# Patient Record
Sex: Female | Born: 1970 | Race: White | Hispanic: No | Marital: Married | Smoking: Never smoker
Health system: Southern US, Community
[De-identification: ages and names within clinical notes are randomized; demographics above are authoritative.]

## PROBLEM LIST (undated history)

## (undated) DIAGNOSIS — T7840XA Allergy, unspecified, initial encounter: Secondary | ICD-10-CM

## (undated) DIAGNOSIS — R011 Cardiac murmur, unspecified: Secondary | ICD-10-CM

## (undated) HISTORY — DX: Cardiac murmur, unspecified: R01.1

## (undated) HISTORY — PX: INNER EAR SURGERY: SHX679

## (undated) HISTORY — DX: Allergy, unspecified, initial encounter: T78.40XA

## (undated) HISTORY — PX: NEPHRECTOMY: SHX65

## (undated) HISTORY — PX: OTHER SURGICAL HISTORY: SHX169

## (undated) HISTORY — PX: BAND HEMORRHOIDECTOMY: SHX1213

---

## 2015-05-18 ENCOUNTER — Other Ambulatory Visit: Payer: Self-pay

## 2015-05-18 ENCOUNTER — Ambulatory Visit
Admission: RE | Admit: 2015-05-18 | Discharge: 2015-05-18 | Disposition: A | Discharge status: Home / Self Care | Payer: 59 | Source: Ambulatory Visit

## 2015-05-18 DIAGNOSIS — Z1231 Encounter for screening mammogram for malignant neoplasm of breast: Secondary | ICD-10-CM

## 2015-05-24 ENCOUNTER — Other Ambulatory Visit: Payer: Self-pay | Admitting: Internal Medicine

## 2015-05-24 DIAGNOSIS — R928 Other abnormal and inconclusive findings on diagnostic imaging of breast: Secondary | ICD-10-CM

## 2015-05-30 ENCOUNTER — Ambulatory Visit
Admission: RE | Admit: 2015-05-30 | Discharge: 2015-05-30 | Disposition: A | Discharge status: Home / Self Care | Payer: 59 | Source: Ambulatory Visit | Attending: Internal Medicine | Admitting: Internal Medicine

## 2015-05-30 DIAGNOSIS — R928 Other abnormal and inconclusive findings on diagnostic imaging of breast: Secondary | ICD-10-CM

## 2015-06-15 ENCOUNTER — Encounter: Payer: Self-pay | Admitting: Internal Medicine

## 2015-06-15 ENCOUNTER — Ambulatory Visit (INDEPENDENT_AMBULATORY_CARE_PROVIDER_SITE_OTHER): Payer: 59 | Admitting: Internal Medicine

## 2015-06-15 VITALS — BP 128/86 | HR 72 | Temp 98.7°F | Resp 16 | Ht 66.5 in | Wt 166.0 lb

## 2015-06-15 DIAGNOSIS — Z0000 Encounter for general adult medical examination without abnormal findings: Secondary | ICD-10-CM | POA: Diagnosis not present

## 2015-06-15 DIAGNOSIS — H9192 Unspecified hearing loss, left ear: Secondary | ICD-10-CM | POA: Insufficient documentation

## 2015-06-15 DIAGNOSIS — M545 Low back pain: Secondary | ICD-10-CM

## 2015-06-15 DIAGNOSIS — G8929 Other chronic pain: Secondary | ICD-10-CM | POA: Insufficient documentation

## 2015-06-15 NOTE — Progress Notes (Signed)
Pre visit review using our clinic review tool, if applicable. No additional management support is needed unless otherwise documented below in the visit note. 

## 2015-06-15 NOTE — Progress Notes (Signed)
Subjective:    Patient ID: Faith Thornton, female    DOB: 06-25-1970, 45 y.o.   MRN: [id number]  HPI She is here to establish with a new pcp.   She is here for a physical.  She is currently being evaluated to be a kidney donor for her brother - being done through [facility]. They have just done complete blood work and she will try to get me a copy.  She sees a chiropractor regularly for occasional back pain.  She is working on increasing her core strength.   Medications and allergies reviewed with patient and updated if appropriate.  Patient Active Problem List   Diagnosis Date Noted  . Chronic lower back pain 06/15/2015    No current outpatient prescriptions on file prior to visit.   No current facility-administered medications on file prior to visit.    Past Medical History  Diagnosis Date  . Allergy   . Heart murmur     Past Surgical History  Procedure Laterality Date  . Inner ear surgery    . Knee surgery Right     torn meniscus  . Fallopian tube removal      (one)  . Cesarean section      one  . Band hemorrhoidectomy      Social History   Social History  . Marital Status: Married    Spouse Name: N/A  . Number of Children: 4  . Years of Education: N/A   Social History Main Topics  . Smoking status: Never Smoker   . Smokeless tobacco: Never Used  . Alcohol Use: Yes     Comment: occasional  . Drug Use: No  . Sexual Activity: Not Asked   Other Topics Concern  . 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 Diabetes Mother   . Arthritis Mother   . Hyperlipidemia Mother   . Diabetes Father   . Hypertension Father   . Hyperlipidemia Father   . Heart disease Father     stents placed  . Kidney disease Brother     cause unknown    Review of Systems  Constitutional: Negative for fever, chills, appetite change, fatigue and unexpected weight  change.  HENT: Positive for hearing loss (otosclerosis).   Eyes: Negative for visual disturbance.  Respiratory: Negative for cough, shortness of breath and wheezing.   Cardiovascular: Positive for palpitations (rare flutter) and leg swelling (occ, if on feet all day). Negative for chest pain.  Gastrointestinal: Negative for nausea, abdominal pain, diarrhea, constipation and blood in stool.       No GERD  Genitourinary: Negative for dysuria and hematuria.  Musculoskeletal: Positive for back pain (chronic, lower back pain - sees chiropractor). Negative for myalgias and arthralgias.  Skin: Negative for rash.  Neurological: Negative for dizziness, light-headedness and headaches.  Psychiatric/Behavioral: Negative for dysphoric mood. The patient is not nervous/anxious.        Objective:   Filed Vitals:   06/15/15 0854  BP: 128/86  Pulse: 72  Temp: 98.7 F (37.1 C)  Resp: 16   Filed Weights   06/15/15 0854  Weight: 166 lb (75.297 kg)   Body mass index is 26.39 kg/(m^2).   Physical Exam Constitutional: She appears well-developed and well-nourished. No distress.  HENT:  Head: Normocephalic and atraumatic.  Right Ear: External ear  normal. Normal ear canal and TM Left Ear: External ear normal.  Normal ear canal and TM Mouth/Throat: Oropharynx is clear and moist.  Normal bilateral ear canals and tympanic membranes  Eyes: Conjunctivae and EOM are normal.  Neck: Neck supple. No tracheal deviation present. No thyromegaly present.  No carotid bruit  Cardiovascular: Normal rate, regular rhythm and normal heart sounds.   No murmur heard.  No edema. Pulmonary/Chest: Effort normal and breath sounds normal. No respiratory distress. She has no wheezes. She has no rales.  Breast: deferred to Gyn Abdominal: Soft. She exhibits no distension. There is no tenderness.  Lymphadenopathy: She has no cervical adenopathy.  Skin: Skin is warm and dry. She is not diaphoretic. cyst top of head that is  benign, soft, slightly tender Psychiatric: She has a normal mood and affect. Her behavior is normal.       Assessment & Plan:   Physical exam: Screening blood work done recently by [employer], no need to repeat Immunizations  - declines flu, ? Td up to date - she will check Mammogram up to date Gyn up to date Exercise - not regular, encouraged regular exercise Weight - just above normal, encouraged regular exercise Skin  -  Cyst on scalp-can see dermatology if she wants to have removed Substance abuse - no evidence of substance abuse   Follow up as needed

## 2015-06-15 NOTE — Patient Instructions (Signed)
All other Health Maintenance issues reviewed.   All recommended immunizations and age-appropriate screenings are up-to-date.  No immunizations administered today.   Medications reviewed and updated.  No changes recommended at this time.    Health Maintenance, Female Adopting a healthy lifestyle and getting preventive care can go a long way to promote health and wellness. Talk with your health care provider about what schedule of regular examinations is right for you. This is a good chance for you to check in with your provider about disease prevention and staying healthy. In between checkups, there are plenty of things you can do on your own. Experts have done a lot of research about which lifestyle changes and preventive measures are most likely to keep you healthy. Ask your health care provider for more information. WEIGHT AND DIET  Eat a healthy diet  Be sure to include plenty of vegetables, fruits, low-fat dairy products, and lean protein.  Do not eat a lot of foods high in solid fats, added sugars, or salt.  Get regular exercise. This is one of the most important things you can do for your health.  Most adults should exercise for at least 150 minutes each week. The exercise should increase your heart rate and make you sweat (moderate-intensity exercise).  Most adults should also do strengthening exercises at least twice a week. This is in addition to the moderate-intensity exercise.  Maintain a healthy weight  Body mass index (BMI) is a measurement that can be used to identify possible weight problems. It estimates body fat based on height and weight. Your health care provider can help determine your BMI and help you achieve or maintain a healthy weight.  For females 61 years of age and older:   A BMI below 18.5 is considered underweight.  A BMI of 18.5 to 24.9 is normal.  A BMI of 25 to 29.9 is considered overweight.  A BMI of 30 and above is considered obese.  Watch  levels of cholesterol and blood lipids  You should start having your blood tested for lipids and cholesterol at 47 years of age, then have this test every 5 years.  You may need to have your cholesterol levels checked more often if:  Your lipid or cholesterol levels are high.  You are older than 47 years of age.  You are at high risk for heart disease.  CANCER SCREENING   Lung Cancer  Lung cancer screening is recommended for adults 72-50 years old who are at high risk for lung cancer because of a history of smoking.  A yearly low-dose CT scan of the lungs is recommended for people who:  Currently smoke.  Have quit within the past 15 years.  Have at least a 30-pack-year history of smoking. A pack year is smoking an average of one pack of cigarettes a day for 1 year.  Yearly screening should continue until it has been 15 years since you quit.  Yearly screening should stop if you develop a health problem that would prevent you from having lung cancer treatment.  Breast Cancer  Practice breast self-awareness. This means understanding how your breasts normally appear and feel.  It also means doing regular breast self-exams. Let your health care provider know about any changes, no matter how small.  If you are in your 20s or 30s, you should have a clinical breast exam (CBE) by a health care provider every 1-3 years as part of a regular health exam.  If you are 40 or  older, have a CBE every year. Also consider having a breast X-ray (mammogram) every year.  If you have a family history of breast cancer, talk to your health care provider about genetic screening.  If you are at high risk for breast cancer, talk to your health care provider about having an MRI and a mammogram every year.  Breast cancer gene (BRCA) assessment is recommended for women who have family members with BRCA-related cancers. BRCA-related cancers include:  Breast.  Ovarian.  Tubal.  Peritoneal  cancers.  Results of the assessment will determine the need for genetic counseling and BRCA1 and BRCA2 testing. Cervical Cancer Your health care provider may recommend that you be screened regularly for cancer of the pelvic organs (ovaries, uterus, and vagina). This screening involves a pelvic examination, including checking for microscopic changes to the surface of your cervix (Pap test). You may be encouraged to have this screening done every 3 years, beginning at age 86.  For women ages 61-65, health care providers may recommend pelvic exams and Pap testing every 3 years, or they may recommend the Pap and pelvic exam, combined with testing for human papilloma virus (HPV), every 5 years. Some types of HPV increase your risk of cervical cancer. Testing for HPV may also be done on women of any age with unclear Pap test results.  Other health care providers may not recommend any screening for nonpregnant women who are considered low risk for pelvic cancer and who do not have symptoms. Ask your health care provider if a screening pelvic exam is right for you.  If you have had past treatment for cervical cancer or a condition that could lead to cancer, you need Pap tests and screening for cancer for at least 20 years after your treatment. If Pap tests have been discontinued, your risk factors (such as having a new sexual partner) need to be reassessed to determine if screening should resume. Some women have medical problems that increase the chance of getting cervical cancer. In these cases, your health care provider may recommend more frequent screening and Pap tests. Colorectal Cancer  This type of cancer can be detected and often prevented.  Routine colorectal cancer screening usually begins at 47 years of age and continues through 47 years of age.  Your health care provider may recommend screening at an earlier age if you have risk factors for colon cancer.  Your health care provider may also  recommend using home test kits to check for hidden blood in the stool.  A small camera at the end of a tube can be used to examine your colon directly (sigmoidoscopy or colonoscopy). This is done to check for the earliest forms of colorectal cancer.  Routine screening usually begins at age 22.  Direct examination of the colon should be repeated every 5-10 years through 47 years of age. However, you may need to be screened more often if early forms of precancerous polyps or small growths are found. Skin Cancer  Check your skin from head to toe regularly.  Tell your health care provider about any new moles or changes in moles, especially if there is a change in a mole's shape or color.  Also tell your health care provider if you have a mole that is larger than the size of a pencil eraser.  Always use sunscreen. Apply sunscreen liberally and repeatedly throughout the day.  Protect yourself by wearing long sleeves, pants, a wide-brimmed hat, and sunglasses whenever you are outside. HEART DISEASE, DIABETES,  AND HIGH BLOOD PRESSURE   High blood pressure causes heart disease and increases the risk of stroke. High blood pressure is more likely to develop in:  People who have blood pressure in the high end of the normal range (130-139/85-89 mm Hg).  People who are overweight or obese.  People who are African American.  If you are 70-80 years of age, have your blood pressure checked every 3-5 years. If you are 7 years of age or older, have your blood pressure checked every year. You should have your blood pressure measured twice--once when you are at a hospital or clinic, and once when you are not at a hospital or clinic. Record the average of the two measurements. To check your blood pressure when you are not at a hospital or clinic, you can use:  An automated blood pressure machine at a pharmacy.  A home blood pressure monitor.  If you are between 56 years and 59 years old, ask your health  care provider if you should take aspirin to prevent strokes.  Have regular diabetes screenings. This involves taking a blood sample to check your fasting blood sugar level.  If you are at a normal weight and have a low risk for diabetes, have this test once every three years after 47 years of age.  If you are overweight and have a high risk for diabetes, consider being tested at a younger age or more often. PREVENTING INFECTION  Hepatitis B  If you have a higher risk for hepatitis B, you should be screened for this virus. You are considered at high risk for hepatitis B if:  You were born in a country where hepatitis B is common. Ask your health care provider which countries are considered high risk.  Your parents were born in a high-risk country, and you have not been immunized against hepatitis B (hepatitis B vaccine).  You have HIV or AIDS.  You use needles to inject street drugs.  You live with someone who has hepatitis B.  You have had sex with someone who has hepatitis B.  You get hemodialysis treatment.  You take certain medicines for conditions, including cancer, organ transplantation, and autoimmune conditions. Hepatitis C  Blood testing is recommended for:  Everyone born from [birth year] through 1967.  Anyone with known risk factors for hepatitis C. Sexually transmitted infections (STIs)  You should be screened for sexually transmitted infections (STIs) including gonorrhea and chlamydia if:  You are sexually active and are younger than 46 years of age.  You are older than 47 years of age and your health care provider tells you that you are at risk for this type of infection.  Your sexual activity has changed since you were last screened and you are at an increased risk for chlamydia or gonorrhea. Ask your health care provider if you are at risk.  If you do not have HIV, but are at risk, it may be recommended that you take a prescription medicine daily to prevent HIV  infection. This is called pre-exposure prophylaxis (PrEP). You are considered at risk if:  You are sexually active and do not regularly use condoms or know the HIV status of your partner(s).  You take drugs by injection.  You are sexually active with a partner who has HIV. Talk with your health care provider about whether you are at high risk of being infected with HIV. If you choose to begin PrEP, you should first be tested for HIV. You should then be  tested every 3 months for as long as you are taking PrEP.  PREGNANCY   If you are premenopausal and you may become pregnant, ask your health care provider about preconception counseling.  If you may become pregnant, take 400 to 800 micrograms (mcg) of folic acid every day.  If you want to prevent pregnancy, talk to your health care provider about birth control (contraception). OSTEOPOROSIS AND MENOPAUSE   Osteoporosis is a disease in which the bones lose minerals and strength with aging. This can result in serious bone fractures. Your risk for osteoporosis can be identified using a bone density scan.  If you are 61 years of age or older, or if you are at risk for osteoporosis and fractures, ask your health care provider if you should be screened.  Ask your health care provider whether you should take a calcium or vitamin D supplement to lower your risk for osteoporosis.  Menopause may have certain physical symptoms and risks.  Hormone replacement therapy may reduce some of these symptoms and risks. Talk to your health care provider about whether hormone replacement therapy is right for you.  HOME CARE INSTRUCTIONS   Schedule regular health, dental, and eye exams.  Stay current with your immunizations.   Do not use any tobacco products including cigarettes, chewing tobacco, or electronic cigarettes.  If you are pregnant, do not drink alcohol.  If you are breastfeeding, limit how much and how often you drink alcohol.  Limit  alcohol intake to no more than 1 drink per day for nonpregnant women. One drink equals 12 ounces of beer, 5 ounces of wine, or 1 ounces of hard liquor.  Do not use street drugs.  Do not share needles.  Ask your health care provider for help if you need support or information about quitting drugs.  Tell your health care provider if you often feel depressed.  Tell your health care provider if you have ever been abused or do not feel safe at home.   This information is not intended to replace advice given to you by your health care provider. Make sure you discuss any questions you have with your health care provider.   Document Released: 11/13/2010 Document Revised: 05/21/2014 Document Reviewed: 04/01/2013 Elsevier Interactive Patient Education [employer].

## 2015-12-13 ENCOUNTER — Ambulatory Visit (INDEPENDENT_AMBULATORY_CARE_PROVIDER_SITE_OTHER): Payer: 59 | Admitting: Internal Medicine

## 2015-12-13 ENCOUNTER — Other Ambulatory Visit (INDEPENDENT_AMBULATORY_CARE_PROVIDER_SITE_OTHER): Payer: 59

## 2015-12-13 ENCOUNTER — Encounter: Payer: Self-pay | Admitting: Internal Medicine

## 2015-12-13 DIAGNOSIS — Z905 Acquired absence of kidney: Secondary | ICD-10-CM

## 2015-12-13 DIAGNOSIS — Z524 Kidney donor: Secondary | ICD-10-CM | POA: Diagnosis not present

## 2015-12-13 LAB — COMPREHENSIVE METABOLIC PANEL
ALT: 13 U/L (ref 0–35)
AST: 16 U/L (ref 0–37)
Albumin: 4.1 g/dL (ref 3.5–5.2)
Alkaline Phosphatase: 38 U/L — ABNORMAL LOW (ref 39–117)
BILIRUBIN TOTAL: 0.4 mg/dL (ref 0.2–1.2)
BUN: 18 mg/dL (ref 6–23)
CO2: 26 meq/L (ref 19–32)
CREATININE: 0.92 mg/dL (ref 0.40–1.20)
Calcium: 9.4 mg/dL (ref 8.4–10.5)
Chloride: 102 mEq/L (ref 96–112)
GFR: 69.42 mL/min (ref >60.00)
GLUCOSE: 76 mg/dL (ref 70–99)
Potassium: 4.3 mEq/L (ref 3.5–5.1)
Sodium: 135 mEq/L (ref 135–145)
Total Protein: 7.6 g/dL (ref 6.0–8.3)

## 2015-12-13 LAB — CBC WITH DIFFERENTIAL/PLATELET
BASOS ABS: 0 10*3/uL (ref 0.0–0.1)
Basophils Relative: 0.5 % (ref 0.0–3.0)
EOS ABS: 0.2 10*3/uL (ref 0.0–0.7)
Eosinophils Relative: 2.5 % (ref 0.0–5.0)
HCT: 36.7 % (ref 36.0–46.0)
Hemoglobin: 12.6 g/dL (ref 12.0–15.0)
LYMPHS ABS: 2.8 10*3/uL (ref 0.7–4.0)
Lymphocytes Relative: 30.1 % (ref 12.0–46.0)
MCHC: 34.4 g/dL (ref 30.0–36.0)
MCV: 92.4 fl (ref 78.0–100.0)
MONOS PCT: 9.8 % (ref 3.0–12.0)
Monocytes Absolute: 0.9 10*3/uL (ref 0.1–1.0)
NEUTROS ABS: 5.3 10*3/uL (ref 1.4–7.7)
NEUTROS PCT: 57.1 % (ref 43.0–77.0)
PLATELETS: 258 10*3/uL (ref 150.0–400.0)
RBC: 3.97 Mil/uL (ref 3.87–5.11)
RDW: 13.7 % (ref 11.5–15.5)
WBC: 9.2 10*3/uL (ref 4.0–10.5)

## 2015-12-13 NOTE — Patient Instructions (Signed)
   Test(s) ordered today. Your results will be released to MyChart (or called to you) after review, usually within 72hours after test completion. If any changes need to be made, you will be notified at that same time.   Medications reviewed and updated.  No changes recommended at this time.   Please followup annually     

## 2015-12-13 NOTE — Assessment & Plan Note (Signed)
Check labs Low sodium diet, avoid nsaids, continue increased water intake Start regular exercise

## 2015-12-13 NOTE — Progress Notes (Signed)
Pre visit review using our clinic review tool, if applicable. No additional management support is needed unless otherwise documented below in the visit note. 

## 2015-12-13 NOTE — Progress Notes (Signed)
Subjective:    Patient ID: Faith Thornton, female    DOB: 1970/08/16, 45 y.o.   MRN: [id number]  HPI She is here for follow up.  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 She is doing well.  She has mild discomfort at the surgical site which she feels may be scar tissue.    Complaint with low sodium diet. She is not exercising.  She wanted to have her kidney function rechecked - she wants to make sure it is good.  She is drinking a lot of water.  She is eating healthy.   Medications and allergies reviewed with patient and updated if appropriate.  Patient Active Problem List   Diagnosis Date Noted  . H/O kidney donation 12/13/2015  . Chronic lower back pain 06/15/2015  . Hearing difficulty of left ear 06/15/2015    No current outpatient prescriptions on file prior to visit.   No current facility-administered medications on file prior to visit.     Past Medical History:  Diagnosis Date  . Allergy   . Heart murmur     Past Surgical History:  Procedure Laterality Date  . BAND HEMORRHOIDECTOMY    . CESAREAN SECTION     one  . fallopian tube removal     (one)  . INNER EAR SURGERY    . knee surgery Right    torn meniscus    Social History   Social History  . Marital status: Married    Spouse name: N/A  . Number of children: 4  . Years of education: N/A   Social History Main Topics  . Smoking status: Never Smoker  . Smokeless tobacco: Never Used  . Alcohol use Yes     Comment: occasional  . Drug use: No  . Sexual activity: Not on file   Other Topics Concern  . 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 Diabetes Mother   . Arthritis Mother   . Hyperlipidemia Mother   . Diabetes Father   . Hypertension Father   . Hyperlipidemia Father   . Heart disease Father     stents placed  . Kidney disease Brother     cause  unknown    Review of Systems  Constitutional: Negative for chills and fever.  Respiratory: Negative for cough, shortness of breath and wheezing.   Cardiovascular: Negative for chest pain, palpitations and leg swelling.  Endocrine: Negative for polydipsia and polyuria.  Genitourinary: Negative for dysuria, frequency and hematuria.  Neurological: Positive for headaches (may be from changes in vision). Negative for light-headedness.       Objective:   Vitals:   12/13/15 1105  BP: 122/82  Pulse: 69  Resp: 16  Temp: 98.2 F (36.8 C)   Filed Weights   12/13/15 1105  Weight: 170 lb (77.1 kg)   Body mass index is 26.63 kg/m.   Physical Exam Constitutional: Appears well-developed and well-nourished. No distress.  HENT:  Head: Normocephalic and atraumatic.  Neck: Neck supple. No tracheal deviation present. No thyromegaly present.  Cardiovascular: Normal rate, regular rhythm and normal heart sounds.   No murmur heard. No carotid bruit  Pulmonary/Chest: Effort normal and breath sounds normal. No respiratory distress. No has no wheezes. No rales.  Abdomen: soft, nontender, nondistended Musculoskeletal: No edema.  Lymphadenopathy:  No cervical adenopathy.  Skin: Skin is warm and dry. Not diaphoretic.  Psychiatric: Normal mood and affect. Behavior is normal.         Assessment & Plan:   See Problem List for Assessment and Plan of chronic medical problems.

## 2016-05-10 ENCOUNTER — Encounter (HOSPITAL_COMMUNITY): Payer: Self-pay | Admitting: Emergency Medicine

## 2016-05-10 ENCOUNTER — Emergency Department (HOSPITAL_COMMUNITY): Payer: 59

## 2016-05-10 ENCOUNTER — Emergency Department (HOSPITAL_COMMUNITY)
Admission: EM | Admit: 2016-05-10 | Discharge: 2016-05-10 | Disposition: A | Discharge status: Home / Self Care | Payer: 59 | Attending: Emergency Medicine | Admitting: Emergency Medicine

## 2016-05-10 DIAGNOSIS — Z5321 Procedure and treatment not carried out due to patient leaving prior to being seen by health care provider: Secondary | ICD-10-CM | POA: Insufficient documentation

## 2016-05-10 DIAGNOSIS — I499 Cardiac arrhythmia, unspecified: Secondary | ICD-10-CM | POA: Insufficient documentation

## 2016-05-10 LAB — CBC
HCT: 39.7 % (ref 36.0–46.0)
HEMOGLOBIN: 13.7 g/dL (ref 12.0–15.0)
MCH: 32.5 pg (ref 26.0–34.0)
MCHC: 34.5 g/dL (ref 30.0–36.0)
MCV: 94.1 fL (ref 78.0–100.0)
PLATELETS: 271 10*3/uL (ref 150–400)
RBC: 4.22 MIL/uL (ref 3.87–5.11)
RDW: 12.6 % (ref 11.5–15.5)
WBC: 7.4 10*3/uL (ref 4.0–10.5)

## 2016-05-10 LAB — BASIC METABOLIC PANEL
ANION GAP: 9 (ref 5–15)
BUN: 11 mg/dL (ref 6–20)
CO2: 23 mmol/L (ref 22–32)
CREATININE: 0.9 mg/dL (ref 0.44–1.00)
Calcium: 9.2 mg/dL (ref 8.9–10.3)
Chloride: 105 mmol/L (ref 101–111)
GFR calc non Af Amer: >60 mL/min (ref >60)
Glucose, Bld: 121 mg/dL — ABNORMAL HIGH (ref 65–99)
Potassium: 3.4 mmol/L — ABNORMAL LOW (ref 3.5–5.1)
Sodium: 137 mmol/L (ref 135–145)

## 2016-05-10 LAB — I-STAT TROPONIN, ED: TROPONIN I, POC: 0 ng/mL (ref 0.00–0.08)

## 2016-05-10 NOTE — ED Notes (Signed)
Pt states symptoms have resolved and doesn't want to wait any longer. Pt seen leaving ED with steady gait.

## 2016-05-10 NOTE — ED Triage Notes (Signed)
Pt reports episodes of tachycardia followed by chest pain. She has hx of same but these episodes have been much more frequent. Pt reports chest pressure radiates into her neck. She describes these episodes as very distressing.

## 2016-08-01 ENCOUNTER — Encounter: Payer: Self-pay | Admitting: Internal Medicine

## 2016-08-01 ENCOUNTER — Ambulatory Visit (INDEPENDENT_AMBULATORY_CARE_PROVIDER_SITE_OTHER): Payer: 59 | Admitting: Internal Medicine

## 2016-08-01 VITALS — BP 110/70 | HR 66 | Temp 98.4°F | Ht 66.0 in | Wt 169.0 lb

## 2016-08-01 DIAGNOSIS — Z23 Encounter for immunization: Secondary | ICD-10-CM | POA: Diagnosis not present

## 2016-08-01 DIAGNOSIS — Z0000 Encounter for general adult medical examination without abnormal findings: Secondary | ICD-10-CM

## 2016-08-01 NOTE — Progress Notes (Signed)
Subjective:    Patient ID: Faith Thornton, female    DOB: 10-26-1970, 46 y.o.   MRN: [id number]  HPI She is here for a physical exam.   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   She had palpitations just after christmas.  She went to the ED, but did not stay due to the long wait.  She did have a bmp, cbc and EKG done.  Her EKG was sinus rhythm and her blood work was normal.  She was stressed.  She only had one episode since then.     She is not exercising regularly.    Medications and allergies reviewed with patient and updated if appropriate.  Patient Active Problem List   Diagnosis Date Noted  . H/O kidney donation 12/13/2015  . Chronic lower back pain 06/15/2015  . Hearing difficulty of left ear 06/15/2015    No current outpatient prescriptions on file prior to visit.   No current facility-administered medications on file prior to visit.     Past Medical History:  Diagnosis Date  . Allergy   . Heart murmur     Past Surgical History:  Procedure Laterality Date  . BAND HEMORRHOIDECTOMY    . CESAREAN SECTION     one  . fallopian tube removal     (one)  . INNER EAR SURGERY    . knee surgery Right    torn meniscus  . NEPHRECTOMY     Donated a kidney    Social History   Social History  . Marital status: Married    Spouse name: N/A  . Number of children: 4  . Years of education: N/A   Social History Main Topics  . Smoking status: Never Smoker  . Smokeless tobacco: Never Used  . Alcohol use Yes     Comment: occasional  . Drug use: No  . Sexual activity: Not Asked   Other Topics Concern  . 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 Diabetes Mother   . Arthritis Mother   . Hyperlipidemia Mother   . Diabetes Father   . Hypertension Father   . Hyperlipidemia Father   . Heart disease Father     stents placed  . Kidney  disease Brother     cause unknown    Review of Systems  Constitutional: Negative for chills and fever.  Eyes: Negative for visual disturbance.  Respiratory: Positive for wheezing (seasonal). Negative for cough and shortness of breath.   Cardiovascular: Positive for palpitations (rare). Negative for chest pain and leg swelling.  Gastrointestinal: Negative for abdominal pain, blood in stool, constipation, diarrhea and nausea.       No gerd  Genitourinary: Negative for dysuria and hematuria.  Musculoskeletal: Negative for arthralgias, back pain and myalgias.  Skin: Negative for color change and rash.  Neurological: Negative for light-headedness and headaches.  Psychiatric/Behavioral: Negative for dysphoric mood. The patient is not nervous/anxious.        Objective:   Vitals:   08/01/16 1412  BP: 110/70  Pulse: 66  Temp: 98.4 F (36.9 C)   Filed Weights   08/01/16 1412  Weight: 169 lb (76.7 kg)   Body mass index is 27.28 kg/m.  Wt Readings from Last 3 Encounters:  08/01/16 169 lb (76.7 kg)  05/10/16 170 lb (  77.1 kg)  12/13/15 170 lb (77.1 kg)     Physical Exam Constitutional: She appears well-developed and well-nourished. No distress.  HENT:  Head: Normocephalic and atraumatic.  Right Ear: External ear normal. Normal ear canal and TM Left Ear: External ear normal.  Normal ear canal and TM Mouth/Throat: Oropharynx is clear and moist.  Eyes: Conjunctivae and EOM are normal.  Neck: Neck supple. No tracheal deviation present. No thyromegaly present.  No carotid bruit  Cardiovascular: Normal rate, regular rhythm and normal heart sounds.   No murmur heard.  No edema. Pulmonary/Chest: Effort normal and breath sounds normal. No respiratory distress. She has no wheezes. She has no rales.  Breast: deferred to Gyn Abdominal: Soft. She exhibits no distension. There is no tenderness.  Lymphadenopathy: She has no cervical adenopathy.  Skin: Skin is warm and dry. She is not  diaphoretic.  Psychiatric: She has a normal mood and affect. Her behavior is normal.       Assessment & Plan:   Physical exam: Screening blood work      Ordered -liver tests, tsh, lipid panel  - this is all she needs Immunizations      declined flu shot, tdap today Mammogram  Up to date   Gyn   Not up to date - will establish in [place] Eye exams  Up to date  Exercise  None - stressed regular exercise Weight  High BMI - advised weight loss Skin   No concerns Substance abuse  none  See Problem List for Assessment and Plan of chronic medical problems.   She will be moving to [place] this spring.

## 2016-08-01 NOTE — Addendum Note (Signed)
Addended by: Mercer PodWRENN, Rolena Knutson E on: 08/01/2016 03:01 PM   Modules accepted: Orders

## 2016-08-01 NOTE — Patient Instructions (Addendum)
Test(s) ordered today. Your results will be released to [place] (or called to you) after review, usually within 72hours after test completion. If any changes need to be made, you will be notified at that same time.  All other Health Maintenance issues reviewed.   All recommended immunizations and age-appropriate screenings are up-to-date or discussed.  Tetanus immunization administered today.   Medications reviewed and updated.  No changes recommended at this time.     Health Maintenance, Female Adopting a healthy lifestyle and getting preventive care can go a long way to promote health and wellness. Talk with your health care provider about what schedule of regular examinations is right for you. This is a good chance for you to check in with your provider about disease prevention and staying healthy. In between checkups, there are plenty of things you can do on your own. Experts have done a lot of research about which lifestyle changes and preventive measures are most likely to keep you healthy. Ask your health care provider for more information. Weight and diet Eat a healthy diet  Be sure to include plenty of vegetables, fruits, low-fat dairy products, and lean protein.  Do not eat a lot of foods high in solid fats, added sugars, or salt.  Get regular exercise. This is one of the most important things you can do for your health.  Most adults should exercise for at least 150 minutes each week. The exercise should increase your heart rate and make you sweat (moderate-intensity exercise).  Most adults should also do strengthening exercises at least twice a week. This is in addition to the moderate-intensity exercise. Maintain a healthy weight  Body mass index (BMI) is a measurement that can be used to identify possible weight problems. It estimates body fat based on height and weight. Your health care provider can help determine your BMI and help you achieve or maintain a healthy  weight.  For females 20 years of age and older:  A BMI below 18.5 is considered underweight.  A BMI of 18.5 to 24.9 is normal.  A BMI of 25 to 29.9 is considered overweight.  A BMI of 30 and above is considered obese. Watch levels of cholesterol and blood lipids  You should start having your blood tested for lipids and cholesterol at 48 years of age, then have this test every 5 years.  You may need to have your cholesterol levels checked more often if:  Your lipid or cholesterol levels are high.  You are older than 48 years of age.  You are at high risk for heart disease. Cancer screening Lung Cancer  Lung cancer screening is recommended for adults 69-43 years old who are at high risk for lung cancer because of a history of smoking.  A yearly low-dose CT scan of the lungs is recommended for people who:  Currently smoke.  Have quit within the past 15 years.  Have at least a 30-pack-year history of smoking. A pack year is smoking an average of one pack of cigarettes a day for 1 year.  Yearly screening should continue until it has been 15 years since you quit.  Yearly screening should stop if you develop a health problem that would prevent you from having lung cancer treatment. Breast Cancer  Practice breast self-awareness. This means understanding how your breasts normally appear and feel.  It also means doing regular breast self-exams. Let your health care provider know about any changes, no matter how small.  If you are in  your 20s or 30s, you should have a clinical breast exam (CBE) by a health care provider every 1-3 years as part of a regular health exam.  If you are 40 or older, have a CBE every year. Also consider having a breast X-ray (mammogram) every year.  If you have a family history of breast cancer, talk to your health care provider about genetic screening.  If you are at high risk for breast cancer, talk to your health care provider about having an MRI  and a mammogram every year.  Breast cancer gene (BRCA) assessment is recommended for women who have family members with BRCA-related cancers. BRCA-related cancers include:  Breast.  Ovarian.  Tubal.  Peritoneal cancers.  Results of the assessment will determine the need for genetic counseling and BRCA1 and BRCA2 testing. Cervical Cancer  Your health care provider may recommend that you be screened regularly for cancer of the pelvic organs (ovaries, uterus, and vagina). This screening involves a pelvic examination, including checking for microscopic changes to the surface of your cervix (Pap test). You may be encouraged to have this screening done every 3 years, beginning at age 21.  For women ages 30-65, health care providers may recommend pelvic exams and Pap testing every 3 years, or they may recommend the Pap and pelvic exam, combined with testing for human papilloma virus (HPV), every 5 years. Some types of HPV increase your risk of cervical cancer. Testing for HPV may also be done on women of any age with unclear Pap test results.  Other health care providers may not recommend any screening for nonpregnant women who are considered low risk for pelvic cancer and who do not have symptoms. Ask your health care provider if a screening pelvic exam is right for you.  If you have had past treatment for cervical cancer or a condition that could lead to cancer, you need Pap tests and screening for cancer for at least 20 years after your treatment. If Pap tests have been discontinued, your risk factors (such as having a new sexual partner) need to be reassessed to determine if screening should resume. Some women have medical problems that increase the chance of getting cervical cancer. In these cases, your health care provider may recommend more frequent screening and Pap tests. Colorectal Cancer  This type of cancer can be detected and often prevented.  Routine colorectal cancer screening  usually begins at 48 years of age and continues through 48 years of age.  Your health care provider may recommend screening at an earlier age if you have risk factors for colon cancer.  Your health care provider may also recommend using home test kits to check for hidden blood in the stool.  A small camera at the end of a tube can be used to examine your colon directly (sigmoidoscopy or colonoscopy). This is done to check for the earliest forms of colorectal cancer.  Routine screening usually begins at age 50.  Direct examination of the colon should be repeated every 5-10 years through 48 years of age. However, you may need to be screened more often if early forms of precancerous polyps or small growths are found. Skin Cancer  Check your skin from head to toe regularly.  Tell your health care provider about any new moles or changes in moles, especially if there is a change in a mole's shape or color.  Also tell your health care provider if you have a mole that is larger than the size of a   pencil eraser.  Always use sunscreen. Apply sunscreen liberally and repeatedly throughout the day.  Protect yourself by wearing long sleeves, pants, a wide-brimmed hat, and sunglasses whenever you are outside. Heart disease, diabetes, and high blood pressure  High blood pressure causes heart disease and increases the risk of stroke. High blood pressure is more likely to develop in:  People who have blood pressure in the high end of the normal range (130-139/85-89 mm Hg).  People who are overweight or obese.  People who are African American.  If you are 74-5 years of age, have your blood pressure checked every 3-5 years. If you are 27 years of age or older, have your blood pressure checked every year. You should have your blood pressure measured twice-once when you are at a hospital or clinic, and once when you are not at a hospital or clinic. Record the average of the two measurements. To check your  blood pressure when you are not at a hospital or clinic, you can use:  An automated blood pressure machine at a pharmacy.  A home blood pressure monitor.  If you are between 48 years and 61 years old, ask your health care provider if you should take aspirin to prevent strokes.  Have regular diabetes screenings. This involves taking a blood sample to check your fasting blood sugar level.  If you are at a normal weight and have a low risk for diabetes, have this test once every three years after 48 years of age.  If you are overweight and have a high risk for diabetes, consider being tested at a younger age or more often. Preventing infection Hepatitis B  If you have a higher risk for hepatitis B, you should be screened for this virus. You are considered at high risk for hepatitis B if:  You were born in a country where hepatitis B is common. Ask your health care provider which countries are considered high risk.  Your parents were born in a high-risk country, and you have not been immunized against hepatitis B (hepatitis B vaccine).  You have HIV or AIDS.  You use needles to inject street drugs.  You live with someone who has hepatitis B.  You have had sex with someone who has hepatitis B.  You get hemodialysis treatment.  You take certain medicines for conditions, including cancer, organ transplantation, and autoimmune conditions. Hepatitis C  Blood testing is recommended for:  Everyone born from [birth year] through 1967.  Anyone with known risk factors for hepatitis C. Sexually transmitted infections (STIs)  You should be screened for sexually transmitted infections (STIs) including gonorrhea and chlamydia if:  You are sexually active and are younger than 48 years of age.  You are older than 48 years of age and your health care provider tells you that you are at risk for this type of infection.  Your sexual activity has changed since you were last screened and you are at an  increased risk for chlamydia or gonorrhea. Ask your health care provider if you are at risk.  If you do not have HIV, but are at risk, it may be recommended that you take a prescription medicine daily to prevent HIV infection. This is called pre-exposure prophylaxis (PrEP). You are considered at risk if:  You are sexually active and do not regularly use condoms or know the HIV status of your partner(s).  You take drugs by injection.  You are sexually active with a partner who has HIV. Talk with your  health care provider about whether you are at high risk of being infected with HIV. If you choose to begin PrEP, you should first be tested for HIV. You should then be tested every 3 months for as long as you are taking PrEP. Pregnancy  If you are premenopausal and you may become pregnant, ask your health care provider about preconception counseling.  If you may become pregnant, take 400 to 800 micrograms (mcg) of folic acid every day.  If you want to prevent pregnancy, talk to your health care provider about birth control (contraception). Osteoporosis and menopause  Osteoporosis is a disease in which the bones lose minerals and strength with aging. This can result in serious bone fractures. Your risk for osteoporosis can be identified using a bone density scan.  If you are 69 years of age or older, or if you are at risk for osteoporosis and fractures, ask your health care provider if you should be screened.  Ask your health care provider whether you should take a calcium or vitamin D supplement to lower your risk for osteoporosis.  Menopause may have certain physical symptoms and risks.  Hormone replacement therapy may reduce some of these symptoms and risks. Talk to your health care provider about whether hormone replacement therapy is right for you. Follow these instructions at home:  Schedule regular health, dental, and eye exams.  Stay current with your immunizations.  Do not use  any tobacco products including cigarettes, chewing tobacco, or electronic cigarettes.  If you are pregnant, do not drink alcohol.  If you are breastfeeding, limit how much and how often you drink alcohol.  Limit alcohol intake to no more than 1 drink per day for nonpregnant women. One drink equals 12 ounces of beer, 5 ounces of wine, or 1 ounces of hard liquor.  Do not use street drugs.  Do not share needles.  Ask your health care provider for help if you need support or information about quitting drugs.  Tell your health care provider if you often feel depressed.  Tell your health care provider if you have ever been abused or do not feel safe at home. This information is not intended to replace advice given to you by your health care provider. Make sure you discuss any questions you have with your health care provider. Document Released: 11/13/2010 Document Revised: 10/06/2015 Document Reviewed: 02/01/2015 Elsevier Interactive Patient Education  2017 [employer].

## 2016-08-01 NOTE — Progress Notes (Signed)
Pre visit review using our clinic review tool, if applicable. No additional management support is needed unless otherwise documented below in the visit note. 

## 2018-03-13 IMAGING — DX DG CHEST 2V
2 series · 2 of 2 positions shown · non-contrast
Comparison: None.

CLINICAL DATA: Heart palpations for awhile, patient reports it has
gotten more severe X 2 weeks, SOB, and mid chest pressure. HX
pneumonia, heart murmur

EXAM:
CHEST  2 VIEW

[chest pa]
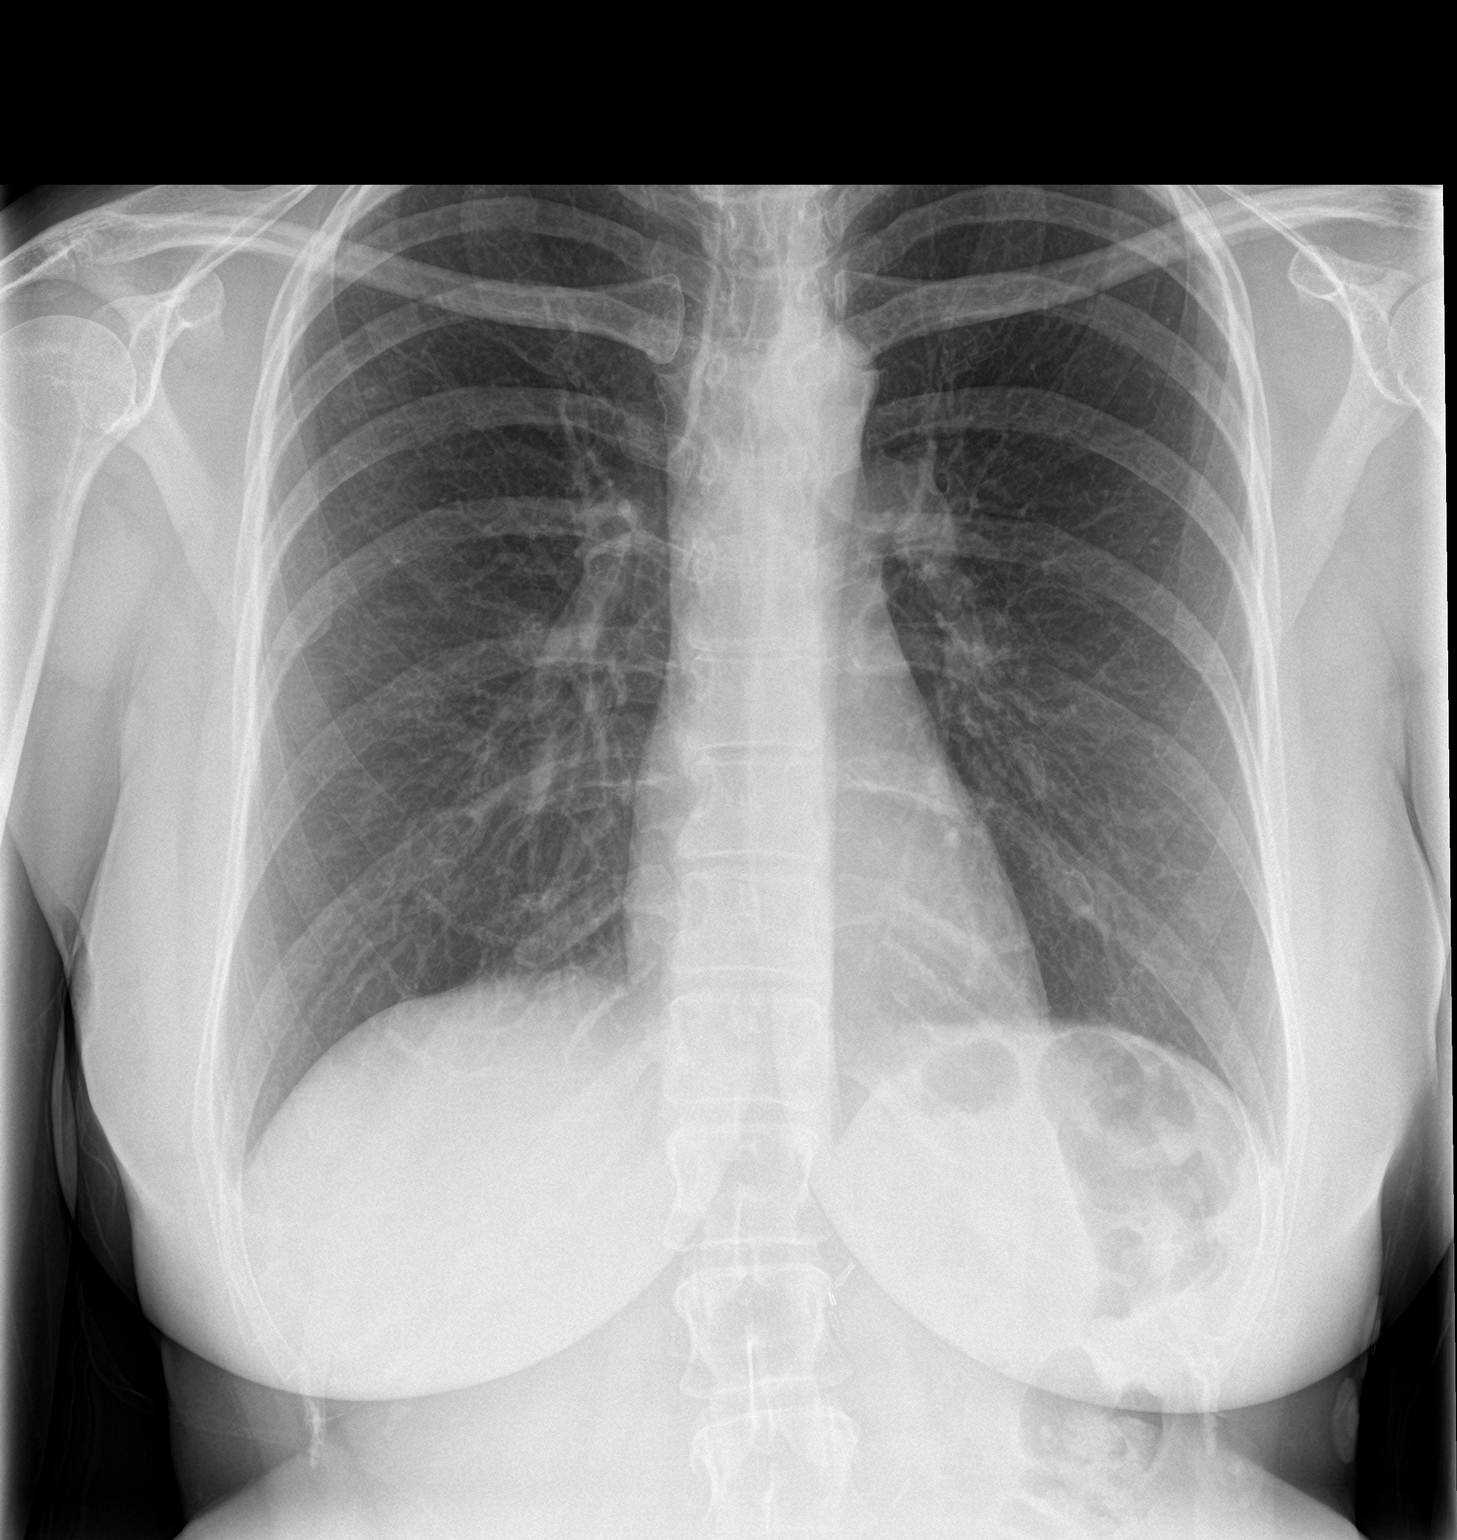

[chest lat]
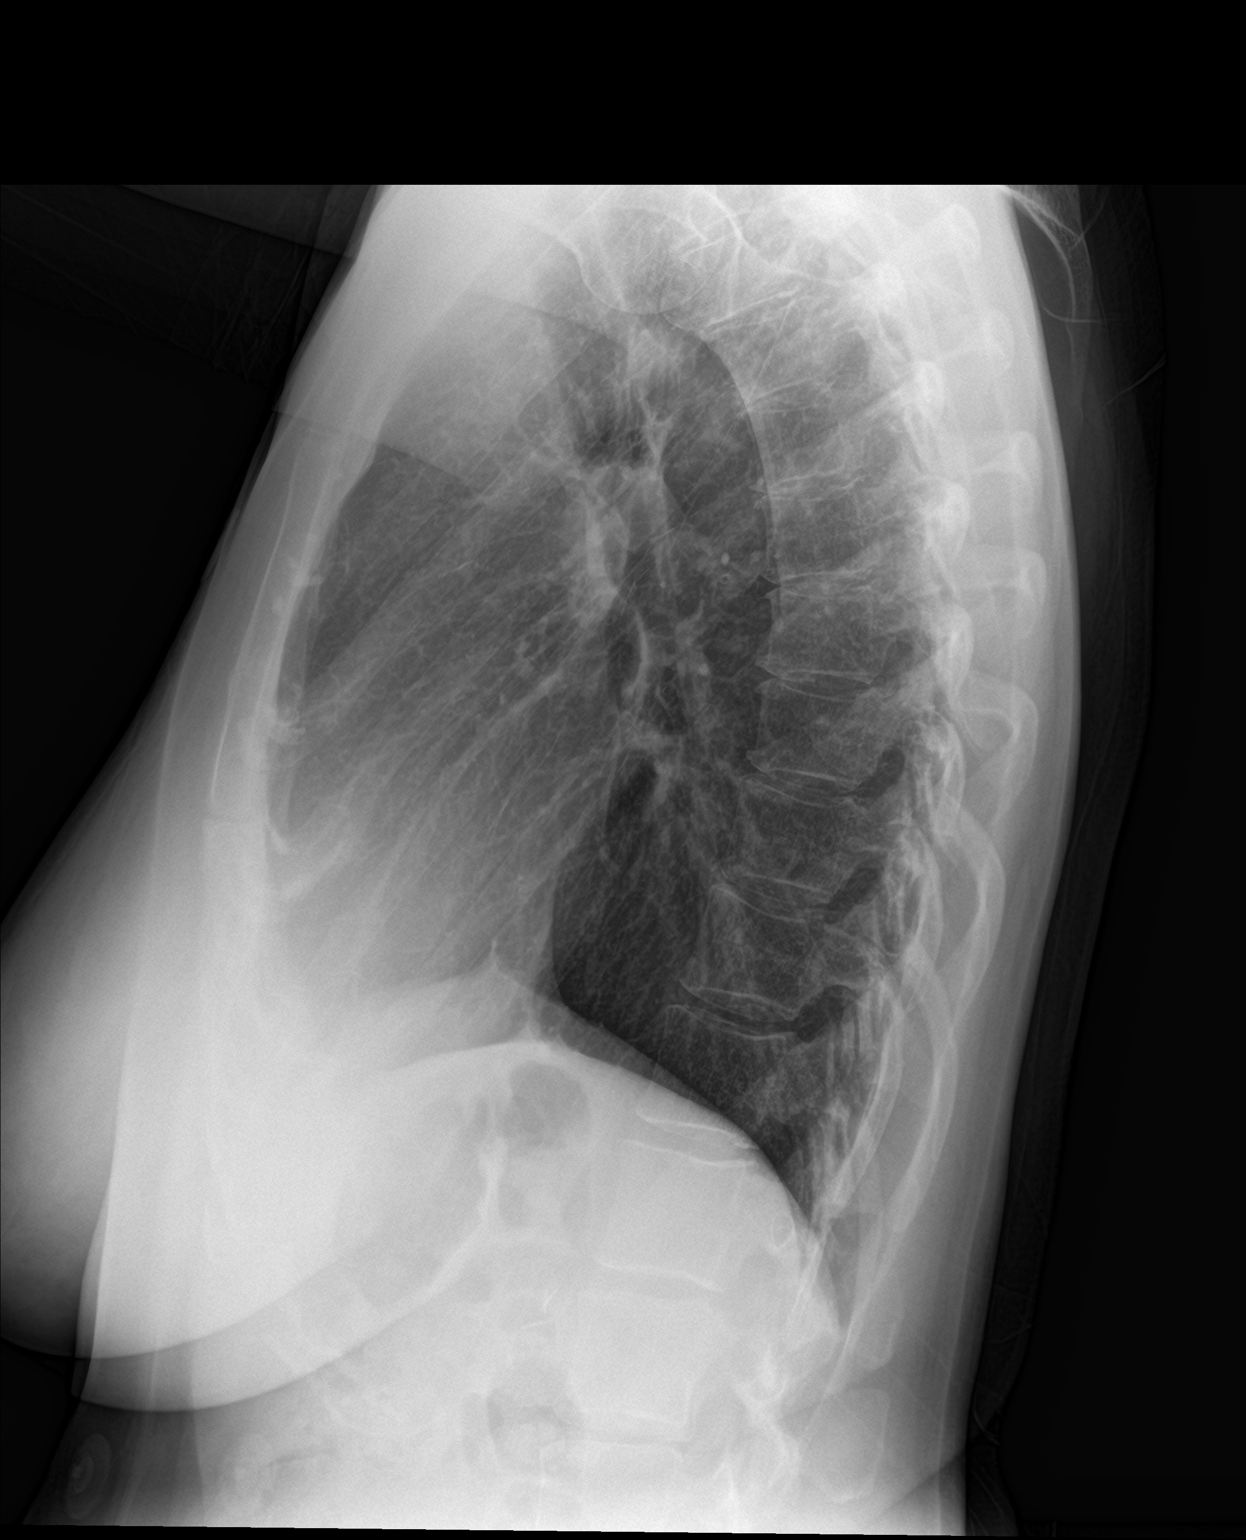

[2 of 2 positions shown; findings below may reference images not displayed]

FINDINGS: The heart size and mediastinal contours are within normal limits.
Both lungs are clear. No pleural effusion or pneumothorax. The
visualized skeletal structures are unremarkable.
IMPRESSION: No active cardiopulmonary disease.
